# Patient Record
Sex: Female | Born: 2000 | Race: Black or African American | Hispanic: No | Marital: Single
Health system: Southern US, Community
[De-identification: ages and names within clinical notes are randomized; demographics above are authoritative.]

---

## 2019-04-07 DIAGNOSIS — L700 Acne vulgaris: Secondary | ICD-10-CM | POA: Diagnosis not present

## 2019-08-08 DIAGNOSIS — Z113 Encounter for screening for infections with a predominantly sexual mode of transmission: Secondary | ICD-10-CM | POA: Diagnosis not present

## 2019-08-08 DIAGNOSIS — Z01419 Encounter for gynecological examination (general) (routine) without abnormal findings: Secondary | ICD-10-CM | POA: Diagnosis not present

## 2020-06-11 DIAGNOSIS — Z3202 Encounter for pregnancy test, result negative: Secondary | ICD-10-CM | POA: Diagnosis not present

## 2020-06-11 DIAGNOSIS — Z30017 Encounter for initial prescription of implantable subdermal contraceptive: Secondary | ICD-10-CM | POA: Diagnosis not present

## 2020-06-11 DIAGNOSIS — Z3046 Encounter for surveillance of implantable subdermal contraceptive: Secondary | ICD-10-CM | POA: Diagnosis not present

## 2020-09-05 DIAGNOSIS — N898 Other specified noninflammatory disorders of vagina: Secondary | ICD-10-CM | POA: Diagnosis not present

## 2020-09-05 DIAGNOSIS — R300 Dysuria: Secondary | ICD-10-CM | POA: Diagnosis not present

## 2021-01-03 DIAGNOSIS — N760 Acute vaginitis: Secondary | ICD-10-CM | POA: Diagnosis not present

## 2021-01-03 DIAGNOSIS — B373 Candidiasis of vulva and vagina: Secondary | ICD-10-CM | POA: Diagnosis not present

## 2021-01-03 DIAGNOSIS — B9689 Other specified bacterial agents as the cause of diseases classified elsewhere: Secondary | ICD-10-CM | POA: Diagnosis not present

## 2021-01-03 DIAGNOSIS — Z7251 High risk heterosexual behavior: Secondary | ICD-10-CM | POA: Diagnosis not present

## 2021-02-24 ENCOUNTER — Emergency Department (HOSPITAL_COMMUNITY): Payer: BC Managed Care – PPO

## 2021-02-24 ENCOUNTER — Other Ambulatory Visit: Payer: Self-pay

## 2021-02-24 ENCOUNTER — Emergency Department (HOSPITAL_COMMUNITY)
Admission: EM | Admit: 2021-02-24 | Discharge: 2021-02-24 | Disposition: A | Discharge status: Home / Self Care | Payer: BC Managed Care – PPO | Attending: Emergency Medicine | Admitting: Emergency Medicine

## 2021-02-24 ENCOUNTER — Encounter (HOSPITAL_COMMUNITY): Payer: Self-pay

## 2021-02-24 DIAGNOSIS — R000 Tachycardia, unspecified: Secondary | ICD-10-CM | POA: Insufficient documentation

## 2021-02-24 DIAGNOSIS — R1013 Epigastric pain: Secondary | ICD-10-CM | POA: Diagnosis not present

## 2021-02-24 DIAGNOSIS — R112 Nausea with vomiting, unspecified: Secondary | ICD-10-CM | POA: Diagnosis not present

## 2021-02-24 DIAGNOSIS — E860 Dehydration: Secondary | ICD-10-CM

## 2021-02-24 DIAGNOSIS — R1033 Periumbilical pain: Secondary | ICD-10-CM | POA: Insufficient documentation

## 2021-02-24 LAB — CBC
HCT: 41.5 % (ref 36.0–46.0)
Hemoglobin: 13.5 g/dL (ref 12.0–15.0)
MCH: 33.3 pg (ref 26.0–34.0)
MCHC: 32.5 g/dL (ref 30.0–36.0)
MCV: 102.2 fL — ABNORMAL HIGH (ref 80.0–100.0)
Platelets: 273 10*3/uL (ref 150–400)
RBC: 4.06 MIL/uL (ref 3.87–5.11)
RDW: 11.9 % (ref 11.5–15.5)
WBC: 30.1 10*3/uL — ABNORMAL HIGH (ref 4.0–10.5)
nRBC: 0 % (ref 0.0–0.2)

## 2021-02-24 LAB — DIFFERENTIAL
Abs Immature Granulocytes: 0.4 10*3/uL — ABNORMAL HIGH (ref 0.00–0.07)
Basophils Absolute: 0.1 10*3/uL (ref 0.0–0.1)
Basophils Relative: 0 %
Eosinophils Absolute: 0 10*3/uL (ref 0.0–0.5)
Eosinophils Relative: 0 %
Immature Granulocytes: 1 %
Lymphocytes Relative: 7 %
Lymphs Abs: 2.1 10*3/uL (ref 0.7–4.0)
Monocytes Absolute: 1.6 10*3/uL — ABNORMAL HIGH (ref 0.1–1.0)
Monocytes Relative: 5 %
Neutro Abs: 25.9 10*3/uL — ABNORMAL HIGH (ref 1.7–7.7)
Neutrophils Relative %: 87 %

## 2021-02-24 LAB — COMPREHENSIVE METABOLIC PANEL
ALT: 32 U/L (ref 0–44)
AST: 60 U/L — ABNORMAL HIGH (ref 15–41)
Albumin: 5.3 g/dL — ABNORMAL HIGH (ref 3.5–5.0)
Alkaline Phosphatase: 54 U/L (ref 38–126)
Anion gap: 21 — ABNORMAL HIGH (ref 5–15)
BUN: 22 mg/dL — ABNORMAL HIGH (ref 6–20)
CO2: 7 mmol/L — ABNORMAL LOW (ref 22–32)
Calcium: 9.6 mg/dL (ref 8.9–10.3)
Chloride: 113 mmol/L — ABNORMAL HIGH (ref 98–111)
Creatinine, Ser: 0.94 mg/dL (ref 0.44–1.00)
GFR, Estimated: >60 mL/min (ref >60)
Glucose, Bld: 47 mg/dL — ABNORMAL LOW (ref 70–99)
Potassium: 4.1 mmol/L (ref 3.5–5.1)
Sodium: 141 mmol/L (ref 135–145)
Total Bilirubin: 0.5 mg/dL (ref 0.3–1.2)
Total Protein: 9.6 g/dL — ABNORMAL HIGH (ref 6.5–8.1)

## 2021-02-24 LAB — URINALYSIS, ROUTINE W REFLEX MICROSCOPIC
Bacteria, UA: NONE SEEN
Bilirubin Urine: NEGATIVE
Glucose, UA: NEGATIVE mg/dL
Ketones, ur: 40 mg/dL — AB
Leukocytes,Ua: NEGATIVE
Nitrite: NEGATIVE
Specific Gravity, Urine: 1.02 (ref 1.005–1.030)
pH: 6 (ref 5.0–8.0)

## 2021-02-24 LAB — I-STAT BETA HCG BLOOD, ED (MC, WL, AP ONLY): I-stat hCG, quantitative: <5 m[IU]/mL (ref <5)

## 2021-02-24 LAB — LIPASE, BLOOD: Lipase: 22 U/L (ref 11–51)

## 2021-02-24 MED ORDER — ONDANSETRON 4 MG PO TBDP: 4.0000 mg | ORAL_TABLET | Freq: Three times a day (TID) | ORAL | 0 refills | Status: DC | PRN

## 2021-02-24 MED ORDER — ONDANSETRON HCL 4 MG/2ML IJ SOLN
4.0000 mg | Freq: Once | INTRAMUSCULAR | Status: AC
Start: 2021-02-24 — End: 2021-02-24
  Administered 2021-02-24: 4 mg via INTRAVENOUS
  Filled 2021-02-24: qty 2

## 2021-02-24 MED ORDER — MORPHINE SULFATE (PF) 4 MG/ML IV SOLN
4.0000 mg | Freq: Once | INTRAVENOUS | Status: AC
  Administered 2021-02-24: 4 mg via INTRAVENOUS
  Filled 2021-02-24: qty 1

## 2021-02-24 MED ORDER — SODIUM CHLORIDE 0.9 % IV BOLUS
1000.0000 mL | Freq: Once | INTRAVENOUS | Status: AC
  Administered 2021-02-24: 1000 mL via INTRAVENOUS

## 2021-02-24 MED ORDER — IOHEXOL 350 MG/ML SOLN
80.0000 mL | Freq: Once | INTRAVENOUS | Status: AC | PRN
  Administered 2021-02-24: 80 mL via INTRAVENOUS

## 2021-02-24 MED ORDER — DEXTROSE-NACL 10-0.45 % IV SOLN
INTRAVENOUS | Status: DC
  Filled 2021-02-24: qty 1000

## 2021-02-24 MED ORDER — DEXTROSE 5 % AND 0.9 % NACL IV BOLUS
1000.0000 mL | Freq: Once | INTRAVENOUS | Status: AC
  Administered 2021-02-24: 1000 mL via INTRAVENOUS

## 2021-02-24 NOTE — ED Triage Notes (Signed)
Pt complains of nausea, vomiting, and abdominal pain since yesterday.

## 2021-02-24 NOTE — Discharge Instructions (Addendum)
He did have an ovarian cyst, does not appear to be the source of your pain however.  Please follow-up with OBG regarding this finding when he understands.  Information provided above if you do not already have blood provider. You can take Zofran as needed for nausea and vomiting. Continue to stay hydrated, return if worse.

## 2021-02-24 NOTE — ED Provider Notes (Signed)
[facility]-EM[facility]GENCY DEPT Provider Note   CSN: [id number] Arrival date & time: 02/24/21  3295     History Chief Complaint  Patient presents with   Abdominal Pain   Nausea   Emesis    Julie Bowen is a 20 y.o. female.   Abdominal Pain Associated symptoms: nausea and vomiting   Associated symptoms: no dysuria, no fever and no hematuria   Emesis Associated symptoms: abdominal pain   Associated symptoms: no fever    Patient presents with abdominal pain.  Started acutely in the middle the night, is been constant since then.  Is primarily in the epigastric area, feels like a cramping pain, does not radiate to the back.  There is associated nausea with multiple episodes of emesis, no hematemesis.  She has not been febrile at home, no dysuria, hematuria, vaginal discharge.  No previous history of abdominal surgeries, no new sexual partners.  She did have a Mimosa last night, denies any other alcohol use.  History reviewed. No pertinent past medical history.  There are no problems to display for this patient.      OB History   No obstetric history on file.     History reviewed. No pertinent family history.     Home Medications Prior to Admission medications   Not on File    Allergies    Patient has no allergy information on record.  Review of Systems   Review of Systems  Constitutional:  Negative for fever.  Gastrointestinal:  Positive for abdominal pain, nausea and vomiting.  Genitourinary:  Negative for dysuria, flank pain and hematuria.   Physical Exam Updated Vital Signs BP 131/82 (BP Location: Left Arm)   Pulse (!) 104   Temp (!) 97.5 F (36.4 C) (Oral)   Resp 20   Ht 5\' 5"  (1.651 m)   Wt 74.8 kg   SpO2 99%   BMI 27.46 kg/m   Physical Exam Vitals and nursing note reviewed. Exam conducted with a chaperone present.  Constitutional:      Appearance: Normal appearance.     Comments: Patient appears uncomfortable  HENT:      Head: Normocephalic and atraumatic.  Eyes:     General: No scleral icterus.       Right eye: No discharge.        Left eye: No discharge.     Extraocular Movements: Extraocular movements intact.     Pupils: Pupils are equal, round, and reactive to light.  Cardiovascular:     Rate and Rhythm: Regular rhythm. Tachycardia present.     Pulses: Normal pulses.     Heart sounds: Normal heart sounds. No murmur heard.   No friction rub. No gallop.  Pulmonary:     Effort: Pulmonary effort is normal. No respiratory distress.     Breath sounds: Normal breath sounds.  Abdominal:     General: Abdomen is flat. Bowel sounds are normal. There is no distension.     Palpations: Abdomen is soft.     Tenderness: There is abdominal tenderness in the epigastric area and periumbilical area. There is no guarding.     Comments: Abdomen is soft, no rigidity or guarding.  She does have tenderness in the epigastric area, no CVA tenderness.  Skin:    General: Skin is warm and dry.     Coloration: Skin is not jaundiced.  Neurological:     Mental Status: She is alert. Mental status is at baseline.     Coordination: Coordination  normal.    ED Results / Procedures / Treatments   Labs (all labs ordered are listed, but only abnormal results are displayed) Labs Reviewed  LIPASE, BLOOD  COMPREHENSIVE METABOLIC PANEL  CBC  I-STAT BETA HCG BLOOD, ED ([facility], [facility], [facility] ONLY)    EKG None  Radiology No results found.  Procedures Procedures   Medications Ordered in ED Medications - No data to display  ED Course  I have reviewed the triage vital signs and the nursing notes.  Pertinent labs & imaging results that were available during my care of the patient were reviewed by me and considered in my medical decision making (see chart for details).  Clinical Course as of 02/24/21 1410  Sun Feb 24, 2021  1409 Urinalysis, Routine w reflex microscopic Urine, Clean Catch(!) Proteinuria and ketones suggestive of  dehydration, no UTI.  Patient is on her period explaining that hemoglobin. [HS]  1409 Comprehensive metabolic panel(!) No gross electrolyte derangement, elevated BUN and AST consistent with dehydration  [HS]  1410 CBC(!) Leukocytosis with left shift, this could be indicative of infectious process in the intra-abdominal pathology.  Also could be reactive to starvation ketosis  [HS]  1410 Lipase, blood Doubt pancreatitis [HS]  1410 I-Stat beta hCG blood, ED Not consistent with ectopic pregnancy. [HS]    Clinical Course User Index [HS] Theron Arista, PA-C   MDM Rules/Calculators/A&P                           Patient is tachycardia, otherwise vitals are stable.  She is not febrile.  She not having any CVA tenderness, doubt kidney stone.  No urinary symptoms, lower suspicion for UTI at this time.  Given that the pain is epigastric after alcohol, pancreatitis is on the differential.  Could also be early appendicitis.  We will check labs, treat pain and then reevaluate.  We will hold off on imaging at this time.  Patient has a leukocytosis of 30 with a left shift.  On reevaluation she states her pain is improved and is now about a 4, she is no longer nauseated.  Will order CT abdomen given the significant leukocytosis with left shift, I have suspicion this is likely early appendicitis.    Patient's last oral intake was at 3 PM last night.  Her last meal was at 10 AM in the morning, she denies eating any lunch or dinner.  Patient is not on blood thinners.  CT abdomen does not show any signs of appendicitis, no emergent pathology that would explain her acute presentation.  She does have a cyst for which I will have her follow-up with an OBG outpatient.  Her pain is improved, she is no longer nauseated having any vomiting.  We will give her some D5 given that we suspect this is starvation ketosis.  I suspect the leukocytosis is reactive.  Patient passed p.o. challenge, we discussed the differential as  well as return precautions.  Patient is appropriate for discharge at this time with follow-up from OBG.  Final Clinical Impression(s) / ED Diagnoses Final diagnoses:  None    Rx / DC Orders ED Discharge Orders     None        Theron Arista, PA-C 02/24/21 1411    Gilda Crease, MD 02/25/21 [phone]

## 2021-02-24 NOTE — ED Notes (Signed)
Patient transported to CT 

## 2021-03-25 DIAGNOSIS — N898 Other specified noninflammatory disorders of vagina: Secondary | ICD-10-CM | POA: Diagnosis not present

## 2021-03-25 DIAGNOSIS — N760 Acute vaginitis: Secondary | ICD-10-CM | POA: Diagnosis not present

## 2021-03-25 DIAGNOSIS — B9689 Other specified bacterial agents as the cause of diseases classified elsewhere: Secondary | ICD-10-CM | POA: Diagnosis not present

## 2021-03-25 DIAGNOSIS — B3731 Acute candidiasis of vulva and vagina: Secondary | ICD-10-CM | POA: Diagnosis not present

## 2021-10-12 DIAGNOSIS — R300 Dysuria: Secondary | ICD-10-CM | POA: Diagnosis not present

## 2021-10-23 ENCOUNTER — Other Ambulatory Visit: Payer: Self-pay | Admitting: Nurse Practitioner

## 2021-10-23 ENCOUNTER — Other Ambulatory Visit (HOSPITAL_COMMUNITY)
Admission: RE | Admit: 2021-10-23 | Discharge: 2021-10-23 | Disposition: A | Discharge status: Home / Self Care | Payer: BC Managed Care – PPO | Source: Ambulatory Visit | Attending: Nurse Practitioner | Admitting: Nurse Practitioner

## 2021-10-23 DIAGNOSIS — Z113 Encounter for screening for infections with a predominantly sexual mode of transmission: Secondary | ICD-10-CM | POA: Diagnosis not present

## 2021-10-23 DIAGNOSIS — Z124 Encounter for screening for malignant neoplasm of cervix: Secondary | ICD-10-CM | POA: Diagnosis not present

## 2021-10-23 DIAGNOSIS — N9412 Deep dyspareunia: Secondary | ICD-10-CM | POA: Diagnosis not present

## 2021-10-23 DIAGNOSIS — L292 Pruritus vulvae: Secondary | ICD-10-CM | POA: Diagnosis not present

## 2021-10-23 DIAGNOSIS — Z01419 Encounter for gynecological examination (general) (routine) without abnormal findings: Secondary | ICD-10-CM | POA: Diagnosis not present

## 2021-10-30 LAB — CYTOLOGY - PAP
Comment: NEGATIVE
Comment: NEGATIVE
Comment: NEGATIVE
HPV 16: NEGATIVE
HPV 18 / 45: NEGATIVE
High risk HPV: POSITIVE — AB

## 2021-11-14 DIAGNOSIS — N390 Urinary tract infection, site not specified: Secondary | ICD-10-CM | POA: Diagnosis not present

## 2021-12-13 ENCOUNTER — Other Ambulatory Visit: Payer: Self-pay

## 2021-12-13 MED ORDER — NITROFURANTOIN MONOHYD MACRO 100 MG PO CAPS
ORAL_CAPSULE | ORAL | 0 refills | Status: DC
  Filled 2021-12-13: qty 10, 5d supply, fill #0

## 2021-12-22 IMAGING — CT CT ABD-PELV W/ CM
2 of 4 series · 16 of 46 positions shown, 18 images · IV contrast (omnipaque)
Comparison: None.

CLINICAL DATA: Right lower quadrant abdominal pain and nausea since
yesterday. Leukocytosis. RLQ abdominal pain, appendicitis suspected
(Age >= 14y)

EXAM:
CT ABDOMEN AND PELVIS WITH CONTRAST
TECHNIQUE: Multidetector CT imaging of the abdomen and pelvis was performed
using the standard protocol following bolus administration of
intravenous contrast.
CONTRAST:  80mL OMNIPAQUE IOHEXOL 350 MG/ML SOLN

[Series 2: axial st · axial · 0.73mm/px · z∈[+1072,+1447]mm · 13 of 85 slices shown, 15 images]
[im 5/85  soft-tissue]
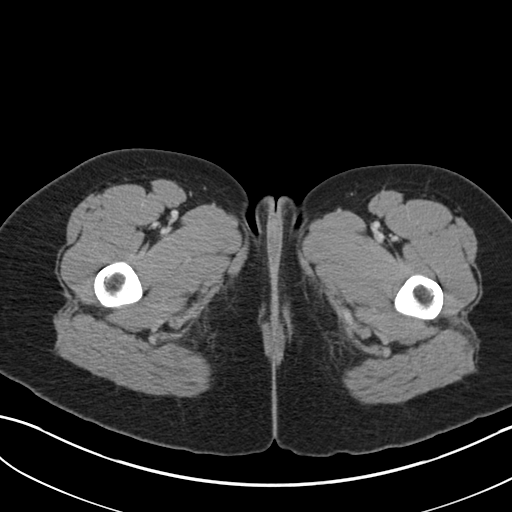
[im 5/85  bone]
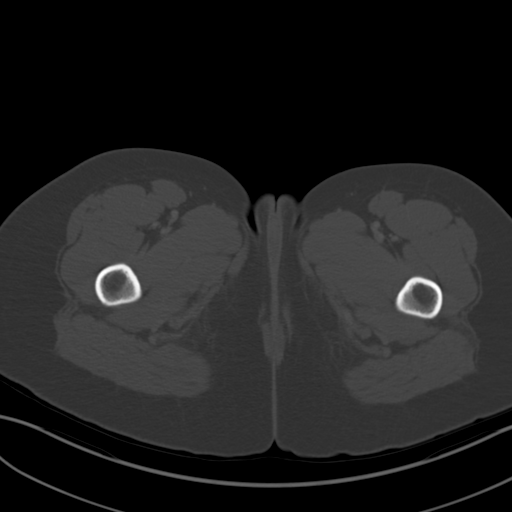
[im 10/85  soft-tissue]
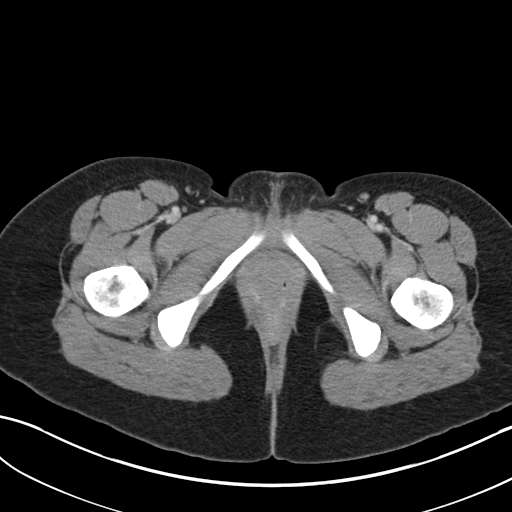
[im 20/85  soft-tissue]
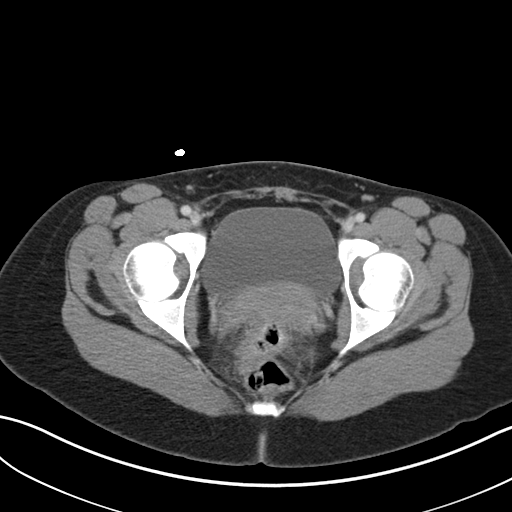
[im 25/85  soft-tissue]
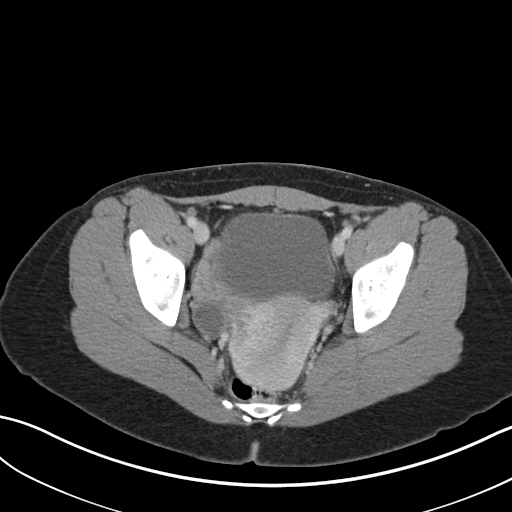
[im 30/85  soft-tissue]
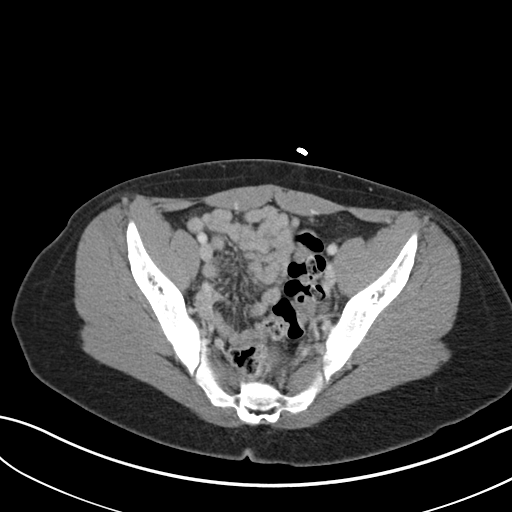
[im 35/85  soft-tissue]
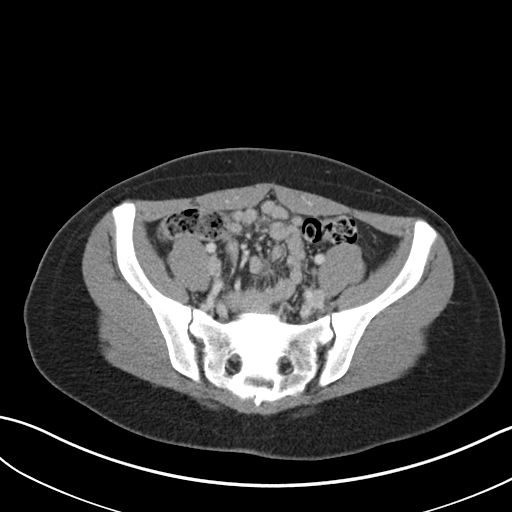
[im 45/85  soft-tissue]
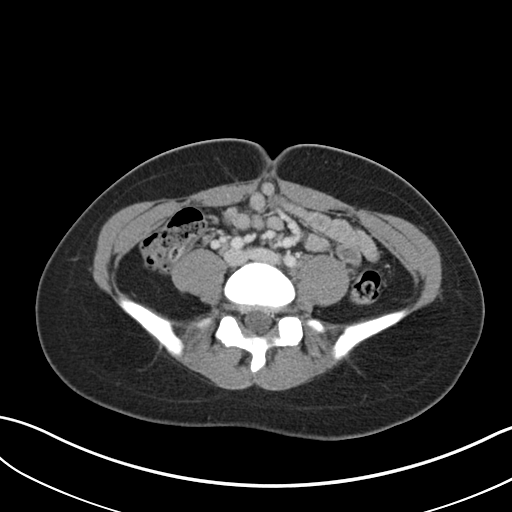
[im 50/85  soft-tissue]
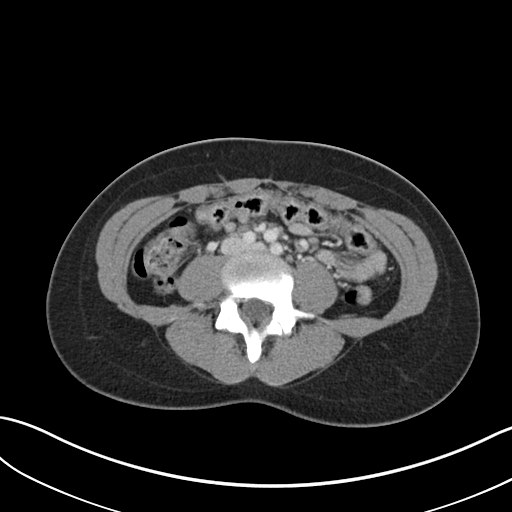
[im 55/85  soft-tissue]
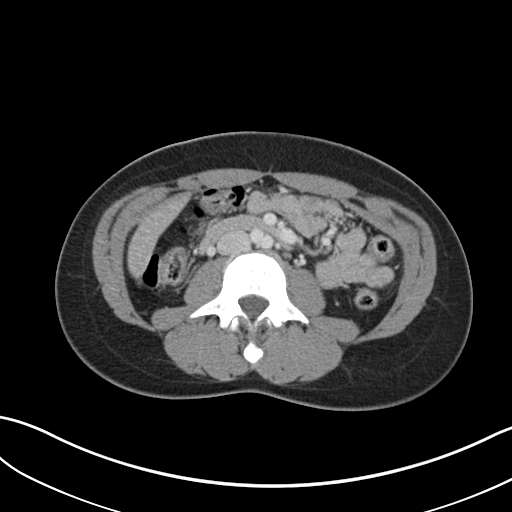
[im 55/85  bone]
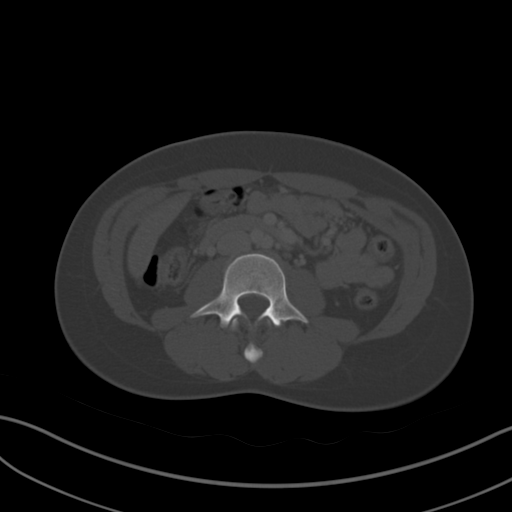
[im 60/85  soft-tissue]
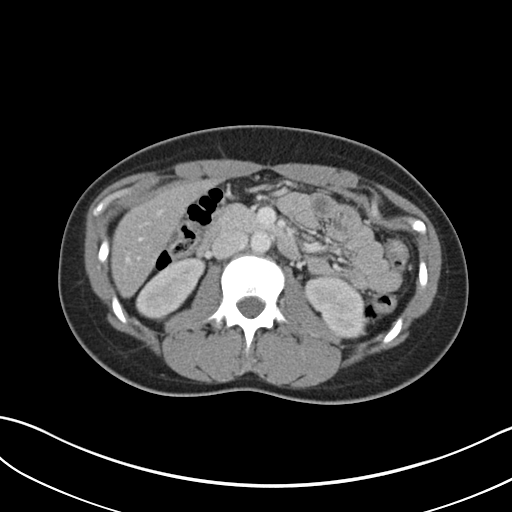
[im 65/85  soft-tissue]
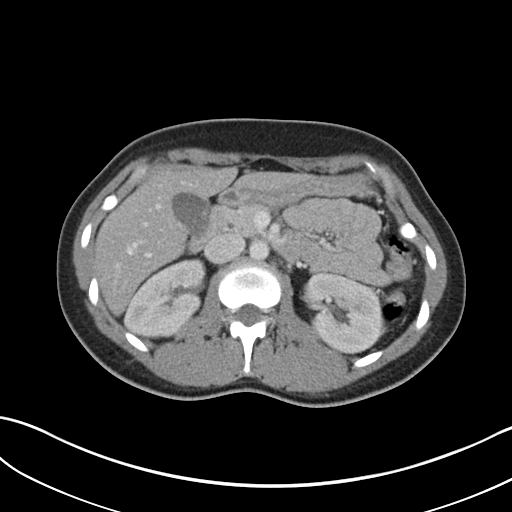
[im 75/85  soft-tissue]
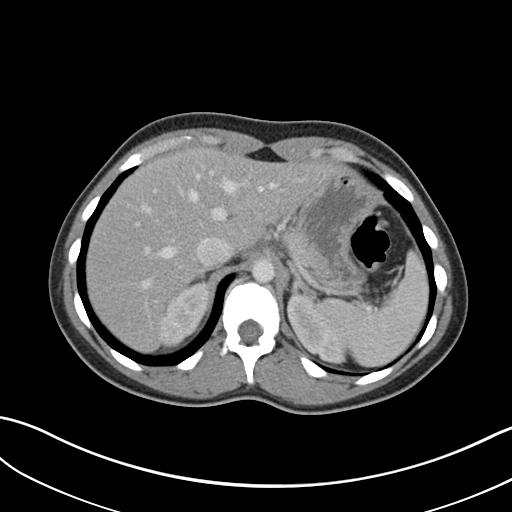
[im 80/85  soft-tissue]
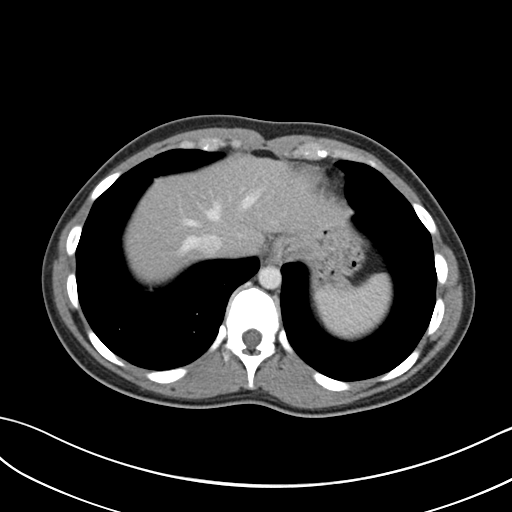

[Series 5: coronal st · coronal · 0.81mm/px · 3 of 130 slices shown]
[im 44/130  soft-tissue]
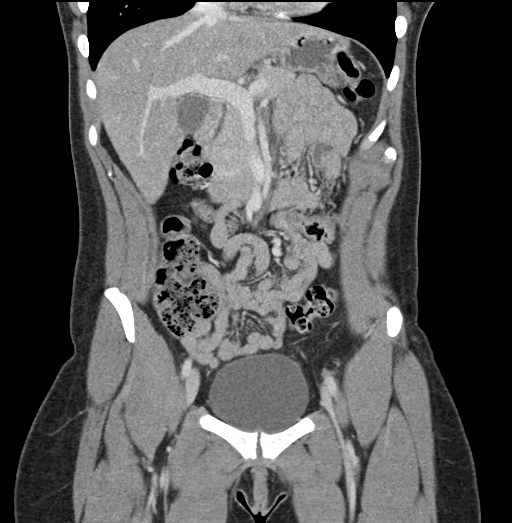
[im 58/130  soft-tissue]
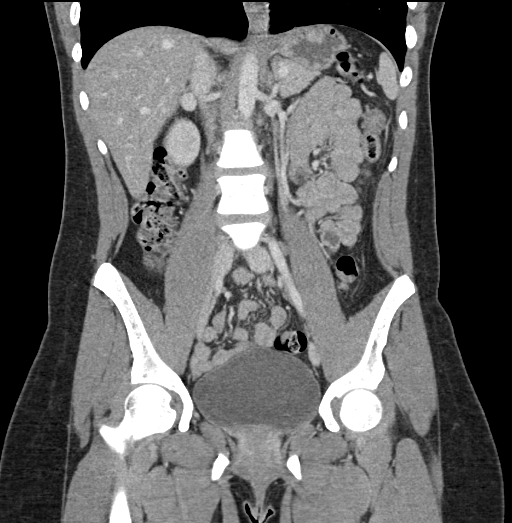
[im 72/130  soft-tissue]
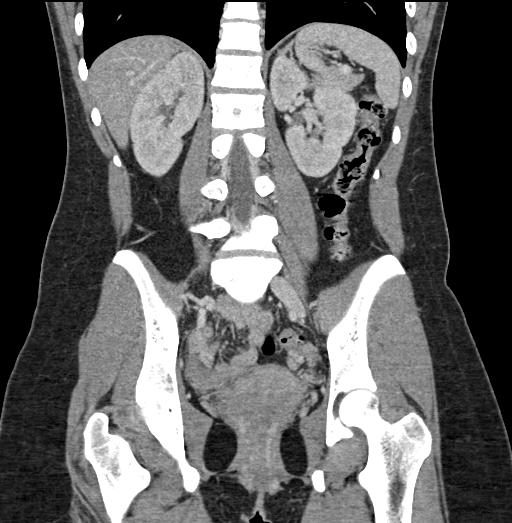

[16 of 46 positions shown; findings below may reference images not displayed]

FINDINGS: Lower chest: No significant pulmonary nodules or acute consolidative
airspace disease.

Hepatobiliary: Normal liver size. No liver mass. Normal gallbladder
with no radiopaque cholelithiasis. No biliary ductal dilatation.

Pancreas: Normal, with no mass or duct dilation.

Spleen: Normal size. No mass.

Adrenals/Urinary Tract: Normal adrenals. Punctate nonobstructing
upper right renal stone. No hydronephrosis. No contour deforming
renal masses. Symmetric normal contrast nephrograms. Normal bladder.

Stomach/Bowel: Normal non-distended stomach. Normal caliber small
bowel with no small bowel wall thickening. Candidate normal
appendix. No pericecal inflammatory changes. Normal large bowel with
no diverticulosis, large bowel wall thickening or pericolonic fat
stranding.

Vascular/Lymphatic: Normal caliber abdominal aorta. Patent portal,
splenic, hepatic and renal veins. No pathologically enlarged lymph
nodes in the abdomen or pelvis.

Reproductive: Normal retroverted uterus. Simple 2.6 cm right adnexal
cyst (series 2/image 62). No left adnexal mass.

Other: No pneumoperitoneum, ascites or focal fluid collection.

Musculoskeletal: No aggressive appearing focal osseous lesions.
IMPRESSION: 1. No evidence of bowel obstruction or acute bowel inflammation.
Normal appendix.
2. Punctate nonobstructing upper right renal stone.
3. Simple 2.6 cm right adnexal cyst. No follow-up imaging
recommended. Note: This recommendation does not apply to
premenarchal patients and to those with increased risk (genetic,
family history, elevated tumor markers or other high-risk factors)
of ovarian cancer. Reference: JACR [DATE]):[id number]

## 2022-01-07 DIAGNOSIS — R350 Frequency of micturition: Secondary | ICD-10-CM | POA: Diagnosis not present

## 2022-01-07 DIAGNOSIS — N3000 Acute cystitis without hematuria: Secondary | ICD-10-CM | POA: Diagnosis not present

## 2022-02-07 DIAGNOSIS — N390 Urinary tract infection, site not specified: Secondary | ICD-10-CM | POA: Diagnosis not present

## 2022-02-07 DIAGNOSIS — R350 Frequency of micturition: Secondary | ICD-10-CM | POA: Diagnosis not present

## 2022-02-17 ENCOUNTER — Telehealth: Payer: BC Managed Care – PPO | Admitting: Family

## 2022-02-17 DIAGNOSIS — A084 Viral intestinal infection, unspecified: Secondary | ICD-10-CM | POA: Diagnosis not present

## 2022-02-17 MED ORDER — ONDANSETRON HCL 4 MG PO TABS: 4.0000 mg | ORAL_TABLET | Freq: Three times a day (TID) | ORAL | 0 refills | Status: DC | PRN

## 2022-02-17 NOTE — Progress Notes (Signed)
Virtual Visit Consent   [name], you are scheduled for a virtual visit with a [facility] provider today. Just as with appointments in the office, your consent must be obtained to participate. Your consent will be active for this visit and any virtual visit you may have with one of our providers in the next 365 days. If you have a MyChart account, a copy of this consent can be sent to you electronically.  As this is a virtual visit, video technology does not allow for your provider to perform a traditional examination. This may limit your provider's ability to fully assess your condition. If your provider identifies any concerns that need to be evaluated in person or the need to arrange testing (such as labs, EKG, etc.), we will make arrangements to do so. Although advances in technology are sophisticated, we cannot ensure that it will always work on either your end or our end. If the connection with a video visit is poor, the visit may have to be switched to a telephone visit. With either a video or telephone visit, we are not always able to ensure that we have a secure connection.  By engaging in this virtual visit, you consent to the provision of healthcare and authorize for your insurance to be billed (if applicable) for the services provided during this visit. Depending on your insurance coverage, you may receive a charge related to this service.  I need to obtain your verbal consent now. Are you willing to proceed with your visit today? Salimata Jeslynn Hollander has provided verbal consent on 02/17/2022 for a virtual visit (video or telephone). Jannifer Rodney, FNP  Date: 02/17/2022 7:35 PM  Virtual Visit via Video Note   I, Jannifer Rodney, connected with  [place] Agent  ([id number], [birth year]) on 02/17/22 at  7:30 PM EDT by a video-enabled telemedicine application and verified that I am speaking with the correct person using two identifiers.  Location: Patient: Virtual  Visit Location Patient: Home Provider: Virtual Visit Location Provider: Home Office   I discussed the limitations of evaluation and management by telemedicine and the availability of in person appointments. The patient expressed understanding and agreed to proceed.    History of Present Illness: Julie Bowen is a 21 y.o. who identifies as a female who was assigned female at birth, and is being seen today for nausea, vomiting, and diarrhea that started today 3 AM.  HPI: Emesis  This is a new problem. The current episode started today. The problem occurs 5 to 10 times per day. The problem has been unchanged. The emesis has an appearance of stomach contents. There has been no fever. Associated symptoms include chills and diarrhea. Pertinent negatives include no headaches or URI. She has tried nothing for the symptoms. The treatment provided no relief.    Problems: There are no problems to display for this patient.   Allergies: No Known Allergies Medications:  Current Outpatient Medications:    ondansetron (ZOFRAN) 4 MG tablet, Take 1 tablet (4 mg total) by mouth every 8 (eight) hours as needed for nausea or vomiting., Disp: 20 tablet, Rfl: 0   etonogestrel (NEXPLANON) 68 MG IMPL implant, 1 each by Subdermal route once., Disp: , Rfl:    ibuprofen (ADVIL) 200 MG tablet, Take 200 mg by mouth every 6 (six) hours as needed for headache, mild pain or moderate pain., Disp: , Rfl:   Observations/Objective: Patient is well-developed, well-nourished in no acute distress.  Resting comfortably  at home.  Head is normocephalic, atraumatic.  No labored breathing.  Speech is clear and coherent with logical content.  Patient is alert and oriented at baseline.    Assessment and Plan: 1. Viral gastroenteritis - ondansetron (ZOFRAN) 4 MG tablet; Take 1 tablet (4 mg total) by mouth every 8 (eight) hours as needed for nausea or vomiting.  Dispense: 20 tablet; Refill: 0  Rest Force fluids B[place]  diet Work note given  Follow up if symptoms worsen or do not improve   Follow Up Instructions: I discussed the assessment and treatment plan with the patient. The patient was provided an opportunity to ask questions and all were answered. The patient agreed with the plan and demonstrated an understanding of the instructions.  A copy of instructions were sent to the patient via MyChart unless otherwise noted below.     The patient was advised to call back or seek an in-person evaluation if the symptoms worsen or if the condition fails to improve as anticipated.  Time:  I spent 8 minutes with the patient via telehealth technology discussing the above problems/concerns.    Jannifer Rodney, FNP

## 2022-04-09 DIAGNOSIS — Z23 Encounter for immunization: Secondary | ICD-10-CM | POA: Diagnosis not present

## 2022-04-09 DIAGNOSIS — Z111 Encounter for screening for respiratory tuberculosis: Secondary | ICD-10-CM | POA: Diagnosis not present

## 2022-04-11 DIAGNOSIS — Z0279 Encounter for issue of other medical certificate: Secondary | ICD-10-CM | POA: Diagnosis not present

## 2022-07-26 DIAGNOSIS — N898 Other specified noninflammatory disorders of vagina: Secondary | ICD-10-CM | POA: Diagnosis not present

## 2022-07-26 DIAGNOSIS — R350 Frequency of micturition: Secondary | ICD-10-CM | POA: Diagnosis not present

## 2022-07-26 DIAGNOSIS — B3731 Acute candidiasis of vulva and vagina: Secondary | ICD-10-CM | POA: Diagnosis not present

## 2022-10-23 DIAGNOSIS — R300 Dysuria: Secondary | ICD-10-CM | POA: Diagnosis not present

## 2022-12-01 DIAGNOSIS — J029 Acute pharyngitis, unspecified: Secondary | ICD-10-CM | POA: Diagnosis not present

## 2022-12-01 DIAGNOSIS — Z6825 Body mass index (BMI) 25.0-25.9, adult: Secondary | ICD-10-CM | POA: Diagnosis not present

## 2022-12-01 DIAGNOSIS — Z03818 Encounter for observation for suspected exposure to other biological agents ruled out: Secondary | ICD-10-CM | POA: Diagnosis not present

## 2022-12-01 DIAGNOSIS — Z20822 Contact with and (suspected) exposure to covid-19: Secondary | ICD-10-CM | POA: Diagnosis not present

## 2023-04-27 DIAGNOSIS — Z113 Encounter for screening for infections with a predominantly sexual mode of transmission: Secondary | ICD-10-CM | POA: Diagnosis not present

## 2023-04-27 DIAGNOSIS — Z3046 Encounter for surveillance of implantable subdermal contraceptive: Secondary | ICD-10-CM | POA: Diagnosis not present

## 2023-04-27 DIAGNOSIS — Z114 Encounter for screening for human immunodeficiency virus [HIV]: Secondary | ICD-10-CM | POA: Diagnosis not present

## 2023-05-27 DIAGNOSIS — Z113 Encounter for screening for infections with a predominantly sexual mode of transmission: Secondary | ICD-10-CM | POA: Diagnosis not present

## 2023-10-16 ENCOUNTER — Encounter: Payer: Self-pay | Admitting: Family Medicine

## 2023-10-16 ENCOUNTER — Ambulatory Visit: Payer: Self-pay | Admitting: Family Medicine

## 2023-10-16 VITALS — BP 110/60 | HR 79 | Temp 98.1°F | Ht 65.0 in | Wt 179.0 lb

## 2023-10-16 DIAGNOSIS — Z7689 Persons encountering health services in other specified circumstances: Secondary | ICD-10-CM | POA: Insufficient documentation

## 2023-10-16 DIAGNOSIS — Z0000 Encounter for general adult medical examination without abnormal findings: Secondary | ICD-10-CM | POA: Diagnosis not present

## 2023-10-16 DIAGNOSIS — Z114 Encounter for screening for human immunodeficiency virus [HIV]: Secondary | ICD-10-CM | POA: Diagnosis not present

## 2023-10-16 DIAGNOSIS — Z83438 Family history of other disorder of lipoprotein metabolism and other lipidemia: Secondary | ICD-10-CM

## 2023-10-16 DIAGNOSIS — Z1159 Encounter for screening for other viral diseases: Secondary | ICD-10-CM | POA: Diagnosis not present

## 2023-10-16 DIAGNOSIS — Z0184 Encounter for antibody response examination: Secondary | ICD-10-CM | POA: Insufficient documentation

## 2023-10-16 DIAGNOSIS — Z23 Encounter for immunization: Secondary | ICD-10-CM | POA: Insufficient documentation

## 2023-10-16 DIAGNOSIS — Z975 Presence of (intrauterine) contraceptive device: Secondary | ICD-10-CM

## 2023-10-16 DIAGNOSIS — Z111 Encounter for screening for respiratory tuberculosis: Secondary | ICD-10-CM | POA: Diagnosis not present

## 2023-10-16 NOTE — Patient Instructions (Signed)

## 2023-10-16 NOTE — Progress Notes (Signed)
 I,Jameka J Llittleton, CMA,acting as a Neurosurgeon for [employer], NP.,have documented all relevant documentation on the behalf of Melodie Spry, NP,as directed by  Melodie Spry, NP while in the presence of Melodie Spry, NP.  Subjective:    Patient ID: Julie Bowen , female    DOB: [birth year] , 23 y.o.   MRN: [id number]  Chief Complaint  Patient presents with   Establish Care   Annual Exam    HPI  Patient presents today to establish care and get her annual physical examination done.  Patient is established with [facility] and has NEXPLANON  in place in [place]. Patient would also like to have a quantitative titer done as well to check her immunity status as requested by her school.  Patient is going to [facility] in August 2025.     History reviewed. No pertinent past medical history.   Family History  Problem Relation Age of Onset   Hypertension Maternal Grandmother      Current Outpatient Medications:    etonogestrel  (NEXPLANON ) 68 MG IMPL implant, 1 each by Subdermal route once., Disp: , Rfl:    spironolactone (ALDACTONE) 50 MG tablet, Take 75 mg by mouth daily., Disp: , Rfl:    No Known Allergies     Social History   Tobacco Use  Smoking Status Never   Passive exposure: Never  Smokeless Tobacco Never   Social History   Substance and Sexual Activity  Alcohol Use Yes      Review of Systems  Constitutional: Negative.   HENT: Negative.    Eyes: Negative.   Respiratory: Negative.    Cardiovascular: Negative.   Gastrointestinal: Negative.   Endocrine: Negative.   Genitourinary: Negative.   Musculoskeletal: Negative.   Skin: Negative.   Allergic/Immunologic: Negative.   Neurological: Negative.   Hematological: Negative.   Psychiatric/Behavioral: Negative.       Today's Vitals   10/16/23 1119  BP: 110/60  Pulse: 79  Temp: 98.1 F (36.7 C)  TempSrc: Oral  Weight: 179 lb (81.2 kg)  Height: 5\' 5"  (1.651 m)  PainSc: 0-No pain   Body mass  index is 29.79 kg/m.  Wt Readings from Last 3 Encounters:  10/16/23 179 lb (81.2 kg)  02/24/21 165 lb (74.8 kg)     Objective:  Physical Exam Constitutional:      Appearance: Normal appearance.  HENT:     Head: Normocephalic.  Cardiovascular:     Rate and Rhythm: Normal rate and regular rhythm.     Pulses: Normal pulses.     Heart sounds: Normal heart sounds.  Pulmonary:     Effort: Pulmonary effort is normal.     Breath sounds: Normal breath sounds.  Abdominal:     General: Bowel sounds are normal.  Musculoskeletal:        General: Normal range of motion.  Skin:    General: Skin is warm and dry.  Neurological:     General: No focal deficit present.     Mental Status: She is alert and oriented to person, place, and time. Mental status is at baseline.  Psychiatric:        Mood and Affect: Mood normal.         Assessment And Plan:     Establishing care with new doctor, encounter for  Encounter for general adult medical examination w/o abnormal findings -     CBC -     CMP14+EGFR  Encounter for hepatitis C screening test for low risk patient -  Hepatitis C antibody  Screening for HIV (human immunodeficiency virus) -     HIV Antibody (routine testing w rflx)  Immunity status testing -     Varicella zoster antibody, IgG -     Measles/Mumps/Rubella Immunity -     Hepatitis B surface antibody,quantitative  Family history of hyperlipidemia -     Lipid panel  Screening-pulmonary TB -     QuantiFERON-TB Gold Plus  Nexplanon  in place Assessment & Plan: LUA      Return for 1 year physical. Patient was given opportunity to ask questions. Patient verbalized understanding of the plan and was able to repeat key elements of the plan. All questions were answered to their satisfaction.   Melodie Spry, NP  I, Melodie Spry, NP, have reviewed all documentation for this visit. The documentation on 10/16/23 for the exam, diagnosis, procedures, and orders are all  accurate and complete.

## 2023-10-16 NOTE — Assessment & Plan Note (Signed)
LUA

## 2023-10-22 ENCOUNTER — Telehealth: Payer: Self-pay

## 2023-10-22 LAB — CMP14+EGFR
ALT: 17 IU/L (ref 0–32)
AST: 24 IU/L (ref 0–40)
Albumin: 4.2 g/dL (ref 4.0–5.0)
Alkaline Phosphatase: 51 IU/L (ref 44–121)
BUN/Creatinine Ratio: 13 (ref 9–23)
BUN: 10 mg/dL (ref 6–20)
Bilirubin Total: 0.4 mg/dL (ref 0.0–1.2)
CO2: 21 mmol/L (ref 20–29)
Calcium: 9.3 mg/dL (ref 8.7–10.2)
Chloride: 104 mmol/L (ref 96–106)
Creatinine, Ser: 0.79 mg/dL (ref 0.57–1.00)
Globulin, Total: 3 g/dL (ref 1.5–4.5)
Glucose: 70 mg/dL (ref 70–99)
Potassium: 4.1 mmol/L (ref 3.5–5.2)
Sodium: 138 mmol/L (ref 134–144)
Total Protein: 7.2 g/dL (ref 6.0–8.5)
eGFR: 107 mL/min/{1.73_m2} (ref >59)

## 2023-10-22 LAB — QUANTIFERON-TB GOLD PLUS
QuantiFERON Mitogen Value: >10 [IU]/mL
QuantiFERON Nil Value: 0.05 [IU]/mL
QuantiFERON TB1 Ag Value: 0.05 [IU]/mL
QuantiFERON TB2 Ag Value: 0.05 [IU]/mL
QuantiFERON-TB Gold Plus: NEGATIVE

## 2023-10-22 LAB — CBC
Hematocrit: 35 % (ref 34.0–46.6)
Hemoglobin: 11.8 g/dL (ref 11.1–15.9)
MCH: 32.4 pg (ref 26.6–33.0)
MCHC: 33.7 g/dL (ref 31.5–35.7)
MCV: 96 fL (ref 79–97)
Platelets: 199 10*3/uL (ref 150–450)
RBC: 3.64 x10E6/uL — ABNORMAL LOW (ref 3.77–5.28)
RDW: 11.8 % (ref 11.7–15.4)
WBC: 7.4 10*3/uL (ref 3.4–10.8)

## 2023-10-22 LAB — LIPID PANEL
Chol/HDL Ratio: 2.2 ratio (ref 0.0–4.4)
Cholesterol, Total: 147 mg/dL (ref 100–199)
HDL: 66 mg/dL (ref >39)
LDL Chol Calc (NIH): 71 mg/dL (ref 0–99)
Triglycerides: 42 mg/dL (ref 0–149)
VLDL Cholesterol Cal: 10 mg/dL (ref 5–40)

## 2023-10-22 LAB — HEPATITIS B SURFACE ANTIBODY, QUANTITATIVE: Hepatitis B Surf Ab Quant: 47.2 m[IU]/mL

## 2023-10-22 LAB — MEASLES/MUMPS/RUBELLA IMMUNITY
MUMPS ABS, IGG: 219 [AU]/ml (ref >10.9)
RUBEOLA AB, IGG: >300 [AU]/ml (ref >16.4)
Rubella Antibodies, IGG: 9.5 {index} (ref >0.99)

## 2023-10-22 LAB — HEPATITIS C ANTIBODY: Hep C Virus Ab: NONREACTIVE

## 2023-10-22 LAB — HIV ANTIBODY (ROUTINE TESTING W REFLEX): HIV Screen 4th Generation wRfx: NONREACTIVE

## 2023-10-22 LAB — VARICELLA ZOSTER ANTIBODY, IGG: Varicella zoster IgG: REACTIVE

## 2023-10-22 NOTE — Telephone Encounter (Signed)
 Patient notified that her forms have been completed. She stated she will come pick them up on Monday. YL,RMA

## 2023-10-22 NOTE — Telephone Encounter (Signed)
 Patient notified that her forms have been completed. She stated she will come pick them up on Monday. Her forms have been placed up front. YL,RMA
